# Patient Record
Sex: Female | Born: 1970 | Race: Black or African American | Hispanic: No | Marital: Single | Smoking: Never smoker
Health system: Southern US, Community
[De-identification: ages and names within clinical notes are randomized; demographics above are authoritative.]

## PROBLEM LIST (undated history)

## (undated) DIAGNOSIS — S060X9A Concussion with loss of consciousness of unspecified duration, initial encounter: Secondary | ICD-10-CM

## (undated) DIAGNOSIS — S060XAA Concussion with loss of consciousness status unknown, initial encounter: Secondary | ICD-10-CM

## (undated) DIAGNOSIS — E611 Iron deficiency: Secondary | ICD-10-CM

## (undated) DIAGNOSIS — E119 Type 2 diabetes mellitus without complications: Secondary | ICD-10-CM

## (undated) DIAGNOSIS — I10 Essential (primary) hypertension: Secondary | ICD-10-CM

## (undated) DIAGNOSIS — D649 Anemia, unspecified: Secondary | ICD-10-CM

## (undated) HISTORY — PX: OTHER SURGICAL HISTORY: SHX169

## (undated) HISTORY — PX: AMPUTATION TOE: SHX6595

---

## 2015-07-11 ENCOUNTER — Inpatient Hospital Stay (HOSPITAL_COMMUNITY)
Admission: EM | Admit: 2015-07-11 | Discharge: 2015-07-13 | DRG: 684 | Discharge status: Against Medical Advice | Payer: Medicare Other | Attending: Family Medicine | Admitting: Family Medicine

## 2015-07-11 ENCOUNTER — Encounter (HOSPITAL_COMMUNITY): Payer: Self-pay

## 2015-07-11 ENCOUNTER — Observation Stay (HOSPITAL_COMMUNITY): Payer: Medicare Other

## 2015-07-11 DIAGNOSIS — E1122 Type 2 diabetes mellitus with diabetic chronic kidney disease: Secondary | ICD-10-CM

## 2015-07-11 DIAGNOSIS — E860 Dehydration: Secondary | ICD-10-CM | POA: Diagnosis not present

## 2015-07-11 DIAGNOSIS — R6881 Early satiety: Secondary | ICD-10-CM | POA: Diagnosis present

## 2015-07-11 DIAGNOSIS — L97529 Non-pressure chronic ulcer of other part of left foot with unspecified severity: Secondary | ICD-10-CM | POA: Diagnosis present

## 2015-07-11 DIAGNOSIS — Z794 Long term (current) use of insulin: Secondary | ICD-10-CM

## 2015-07-11 DIAGNOSIS — I10 Essential (primary) hypertension: Secondary | ICD-10-CM | POA: Diagnosis present

## 2015-07-11 DIAGNOSIS — Z9114 Patient's other noncompliance with medication regimen: Secondary | ICD-10-CM

## 2015-07-11 DIAGNOSIS — D72829 Elevated white blood cell count, unspecified: Secondary | ICD-10-CM | POA: Diagnosis present

## 2015-07-11 DIAGNOSIS — I129 Hypertensive chronic kidney disease with stage 1 through stage 4 chronic kidney disease, or unspecified chronic kidney disease: Secondary | ICD-10-CM | POA: Diagnosis present

## 2015-07-11 DIAGNOSIS — R112 Nausea with vomiting, unspecified: Secondary | ICD-10-CM | POA: Diagnosis present

## 2015-07-11 DIAGNOSIS — D638 Anemia in other chronic diseases classified elsewhere: Secondary | ICD-10-CM | POA: Diagnosis present

## 2015-07-11 DIAGNOSIS — Z89422 Acquired absence of other left toe(s): Secondary | ICD-10-CM

## 2015-07-11 DIAGNOSIS — E11621 Type 2 diabetes mellitus with foot ulcer: Secondary | ICD-10-CM | POA: Diagnosis present

## 2015-07-11 DIAGNOSIS — T8130XA Disruption of wound, unspecified, initial encounter: Secondary | ICD-10-CM

## 2015-07-11 DIAGNOSIS — Z951 Presence of aortocoronary bypass graft: Secondary | ICD-10-CM

## 2015-07-11 DIAGNOSIS — E8809 Other disorders of plasma-protein metabolism, not elsewhere classified: Secondary | ICD-10-CM | POA: Diagnosis present

## 2015-07-11 DIAGNOSIS — R197 Diarrhea, unspecified: Secondary | ICD-10-CM | POA: Diagnosis present

## 2015-07-11 DIAGNOSIS — E1165 Type 2 diabetes mellitus with hyperglycemia: Secondary | ICD-10-CM | POA: Diagnosis present

## 2015-07-11 DIAGNOSIS — Z590 Homelessness: Secondary | ICD-10-CM

## 2015-07-11 DIAGNOSIS — N189 Chronic kidney disease, unspecified: Secondary | ICD-10-CM | POA: Diagnosis present

## 2015-07-11 DIAGNOSIS — N183 Chronic kidney disease, stage 3 (moderate): Secondary | ICD-10-CM

## 2015-07-11 DIAGNOSIS — N179 Acute kidney failure, unspecified: Secondary | ICD-10-CM | POA: Diagnosis not present

## 2015-07-11 DIAGNOSIS — E119 Type 2 diabetes mellitus without complications: Secondary | ICD-10-CM

## 2015-07-11 HISTORY — DX: Essential (primary) hypertension: I10

## 2015-07-11 HISTORY — DX: Anemia, unspecified: D64.9

## 2015-07-11 HISTORY — DX: Concussion with loss of consciousness of unspecified duration, initial encounter: S06.0X9A

## 2015-07-11 HISTORY — DX: Concussion with loss of consciousness status unknown, initial encounter: S06.0XAA

## 2015-07-11 HISTORY — DX: Type 2 diabetes mellitus without complications: E11.9

## 2015-07-11 HISTORY — DX: Iron deficiency: E61.1

## 2015-07-11 LAB — CBC
HCT: 25.8 % — ABNORMAL LOW (ref 36.0–46.0)
Hemoglobin: 8.2 g/dL — ABNORMAL LOW (ref 12.0–15.0)
MCH: 23.9 pg — ABNORMAL LOW (ref 26.0–34.0)
MCHC: 31.8 g/dL (ref 30.0–36.0)
MCV: 75.2 fL — ABNORMAL LOW (ref 78.0–100.0)
PLATELETS: 315 10*3/uL (ref 150–400)
RBC: 3.43 MIL/uL — ABNORMAL LOW (ref 3.87–5.11)
RDW: 17.8 % — AB (ref 11.5–15.5)
WBC: 15.5 10*3/uL — AB (ref 4.0–10.5)

## 2015-07-11 LAB — COMPREHENSIVE METABOLIC PANEL
ALBUMIN: 2.7 g/dL — AB (ref 3.5–5.0)
ALT: 12 U/L — ABNORMAL LOW (ref 14–54)
AST: 18 U/L (ref 15–41)
Alkaline Phosphatase: 72 U/L (ref 38–126)
Anion gap: 7 (ref 5–15)
BUN: 26 mg/dL — AB (ref 6–20)
CALCIUM: 8.3 mg/dL — AB (ref 8.9–10.3)
CO2: 21 mmol/L — ABNORMAL LOW (ref 22–32)
CREATININE: 2.02 mg/dL — AB (ref 0.44–1.00)
Chloride: 105 mmol/L (ref 101–111)
GFR calc Af Amer: 33 mL/min — ABNORMAL LOW (ref >60)
GFR, EST NON AFRICAN AMERICAN: 29 mL/min — AB (ref >60)
Glucose, Bld: 151 mg/dL — ABNORMAL HIGH (ref 65–99)
POTASSIUM: 3.6 mmol/L (ref 3.5–5.1)
SODIUM: 133 mmol/L — AB (ref 135–145)
TOTAL PROTEIN: 7.2 g/dL (ref 6.5–8.1)
Total Bilirubin: 0.7 mg/dL (ref 0.3–1.2)

## 2015-07-11 LAB — LIPASE, BLOOD: LIPASE: 38 U/L (ref 11–51)

## 2015-07-11 MED ORDER — ONDANSETRON HCL 4 MG/2ML IJ SOLN
4.0000 mg | Freq: Four times a day (QID) | INTRAMUSCULAR | Status: DC | PRN
  Administered 2015-07-12: 4 mg via INTRAVENOUS
  Filled 2015-07-11: qty 2

## 2015-07-11 MED ORDER — INSULIN ASPART 100 UNIT/ML ~~LOC~~ SOLN
0.0000 [IU] | SUBCUTANEOUS | Status: DC
  Administered 2015-07-12 (×4): 1 [IU] via SUBCUTANEOUS

## 2015-07-11 MED ORDER — SODIUM CHLORIDE 0.9 % IJ SOLN
3.0000 mL | Freq: Two times a day (BID) | INTRAMUSCULAR | Status: DC
  Administered 2015-07-12 (×3): 3 mL via INTRAVENOUS

## 2015-07-11 MED ORDER — SODIUM CHLORIDE 0.9 % IV SOLN
INTRAVENOUS | Status: AC
  Administered 2015-07-12 (×2): via INTRAVENOUS

## 2015-07-11 MED ORDER — ACETAMINOPHEN 650 MG RE SUPP: 650.0000 mg | Freq: Four times a day (QID) | RECTAL | Status: DC | PRN

## 2015-07-11 MED ORDER — ONDANSETRON HCL 4 MG/2ML IJ SOLN
4.0000 mg | Freq: Once | INTRAMUSCULAR | Status: AC
  Administered 2015-07-11: 4 mg via INTRAVENOUS
  Filled 2015-07-11: qty 2

## 2015-07-11 MED ORDER — ACETAMINOPHEN 325 MG PO TABS: 650.0000 mg | ORAL_TABLET | Freq: Four times a day (QID) | ORAL | Status: DC | PRN

## 2015-07-11 MED ORDER — ONDANSETRON HCL 4 MG PO TABS: 4.0000 mg | ORAL_TABLET | Freq: Four times a day (QID) | ORAL | Status: DC | PRN

## 2015-07-11 MED ORDER — ENOXAPARIN SODIUM 30 MG/0.3ML ~~LOC~~ SOLN
30.0000 mg | Freq: Every day | SUBCUTANEOUS | Status: DC
  Filled 2015-07-11 (×3): qty 0.3

## 2015-07-11 MED ORDER — SODIUM CHLORIDE 0.9 % IV BOLUS (SEPSIS)
500.0000 mL | Freq: Once | INTRAVENOUS | Status: AC
  Administered 2015-07-11: 500 mL via INTRAVENOUS

## 2015-07-11 NOTE — ED Notes (Signed)
Per EMS, pt complains of n/v/d for 3 days. Last oral intake 2PM yesterday. Pt has hx of diabetes, CBG 173. Pt states she has not been taking her medication for hypertension or diabetes. Pt denies abdominal pain.

## 2015-07-11 NOTE — ED Notes (Addendum)
Patient states she has had N/V for several months. Patient states she went to [facility] 2 days ago. Patient states she was asked to leave by security because she did not feel well enough to check in. patient then states she sat in their parking lot for hours afterwards. Patient states she drove herself to the ED today. Patient has open wounds to both feet. 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 Patient also reports that she lives in a shelter in [place] and is trying to relocate to this area. Patient states she was going to the wound care center in [place], but did not want to stay in [place] because she could not obtain proper medical care. Patient states she had increased N/V 2 days ago and was vomiting green bile.

## 2015-07-11 NOTE — ED Notes (Signed)
Bed: WA14 Expected date:  Expected time:  Means of arrival:  Comments: N/V 

## 2015-07-11 NOTE — H&P (Addendum)
PCP:  No primary care provider on file.    Referring provider Resse   Chief Complaint: Nausea vomiting  HPI: Joy Levy is a 44 y.o. female   has a past medical history of Anemia; Concussion; Low iron; Diabetes mellitus without complication (HCC); and Hypertension.   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 Patient is currently homeless resides in a facility, Patient in the passed used to go to wound care in [place] but have since then try to relocate to [place]. Patient states that she has been having some chronic nausea and vomiting but it seems to be worse over past 2 days. This has been associated diarrhea. Patient has not been taking any of her medications. She was noted to have elevated blood patella cut up to 15.5 and elevated creatinine at 2.02. From baseline 1.5 in October Patient's hemoglobin down to 8.2 from baseline of 9.1 and October. She denies blood in stool, still menstruating occasional heavy menses but not on the regular bases.   Guarding patient's chronic ulcers she has been in the past prescribed antibiotics but has not filled them. In October she was seen by [facility] plan was to admit patient and she was given IV vancomycin but left AMA.  She was  in the park for some time in pain and drove to [facility] She may have attempted to present to [facility] yesterday but apparently just set in the waiting room. Hospitalist was called for admission for dehydration and acute on chronic renal failure  Review of Systems:    Pertinent positives include:  nausea, vomiting,  fatigue,   Constitutional:  No weight loss, night sweats, Fevers, chills,weight loss  HEENT:  No headaches, Difficulty swallowing,Tooth/dental problems,Sore throat,  No sneezing, itching, ear ache, nasal congestion, post nasal drip,  Cardio-vascular:  No chest  pain, Orthopnea, PND, anasarca, dizziness, palpitations.no Bilateral lower extremity swelling  GI:  No heartburn, indigestion, abdominal pain,  diarrhea, change in bowel habits, loss of appetite, melena, blood in stool, hematemesis Resp:  no shortness of breath at rest. No dyspnea on exertion, No excess mucus, no productive cough, No non-productive cough, No coughing up of blood.No change in color of mucus.No wheezing. Skin:  no rash or lesions. No jaundice GU:  no dysuria, change in color of urine, no urgency or frequency. No straining to urinate.  No flank pain.  Musculoskeletal:  No joint pain or no joint swelling. No decreased range of motion. No back pain.  Psych:  No change in mood or affect. No depression or anxiety. No memory loss.  Neuro: no localizing neurological complaints, no tingling, no weakness, no double vision, no gait abnormality, no slurred speech, no confusion  Otherwise ROS are negative except for above, 10 systems were reviewed  Past Medical History: Past Medical History  Diagnosis Date  . Anemia   . Concussion   . Low iron   . Diabetes mellitus without complication (HCC)   . Hypertension    Past Surgical History  Procedure Laterality Date  . Amputation toe    . Iron deficiency       Medications: Prior to Admission medications   Not on File    Allergies:  No Known Allergies  Social History:  Ambulatory  Independently  Homeless    reports that she has never smoked. She has never used smokeless tobacco. She reports that she does  not drink alcohol or use illicit drugs.    Family History: family history includes Diabetes in her other; Hypertension in her other.    Physical Exam: Patient Vitals for the past 24 hrs:  BP Temp Temp src Pulse Resp SpO2  07/11/15 2016 153/68 mmHg - - 95 19 96 %  07/11/15 1821 181/80 mmHg 97.7 F (36.5 C) Oral 107 18 100 %    1. General:  in No Acute distress 2. Psychological: Alert and  Oriented 3.  Head/ENT:     Dry Mucous Membranes                          Head Non traumatic, neck supple                            Poor Dentition 4. SKIN:   decreased Skin turgor,  Skin clean Dry well healed Ulceration on the left foot noted     Regular rate and rhythm no Murmur, Rub or gallop 6. Lungs: Clear to auscultation bilaterally, no wheezes or crackles   7. Abdomen: Soft, non-tender, Non distended 8. Lower extremities: no clubbing, cyanosis, or edema 9. Neurologically Grossly intact, moving all 4 extremities equally 10. MSK: Normal range of motion  body mass index is unknown because there is no height or weight on file.   Labs on Admission:   Results for orders placed or performed during the hospital encounter of 07/11/15 (from the past 24 hour(s))  Lipase, blood     Status: None   Collection Time: 07/11/15  8:06 PM  Result Value Ref Range   Lipase 38 11 - 51 U/L  Comprehensive metabolic panel     Status: Abnormal   Collection Time: 07/11/15  8:06 PM  Result Value Ref Range   Sodium 133 (L) 135 - 145 mmol/L   Potassium 3.6 3.5 - 5.1 mmol/L   Chloride 105 101 - 111 mmol/L   CO2 21 (L) 22 - 32 mmol/L   Glucose, Bld 151 (H) 65 - 99 mg/dL   BUN 26 (H) 6 - 20 mg/dL   Creatinine, Ser 1.61 (H) 0.44 - 1.00 mg/dL   Calcium 8.3 (L) 8.9 - 10.3 mg/dL   Total Protein 7.2 6.5 - 8.1 g/dL   Albumin 2.7 (L) 3.5 - 5.0 g/dL   AST 18 15 - 41 U/L   ALT 12 (L) 14 - 54 U/L   Alkaline Phosphatase 72 38 - 126 U/L   Total Bilirubin 0.7 0.3 - 1.2 mg/dL   GFR calc non Af Amer 29 (L) >60 mL/min   GFR calc Af Amer 33 (L) >60 mL/min   Anion gap 7 5 - 15  CBC     Status: Abnormal   Collection Time: 07/11/15  8:06 PM  Result Value Ref Range   WBC 15.5 (H) 4.0 - 10.5 K/uL   RBC 3.43 (L) 3.87 - 5.11 MIL/uL   Hemoglobin 8.2 (L) 12.0 - 15.0 g/dL   HCT 09.6 (L) 04.5 - 40.9 %   MCV 75.2 (L) 78.0 - 100.0 fL   MCH 23.9 (L) 26.0 - 34.0 pg   MCHC 31.8 30.0 - 36.0 g/dL   RDW 81.1 (H) 91.4 - 78.2 %   Platelets  315 150 - 400 K/uL    UA ordered  No results found for: HGBA1C  CrCl cannot be calculated (Unknown ideal weight.).  BNP (last 3 results) No results for  input(s): PROBNP in the last 8760 hours.  Other results:  I have pearsonaly reviewed this: ECG REPORT Not obtained   There were no vitals filed for this visit.   Cultures: No results found for: SDES, SPECREQUEST, CULT, REPTSTATUS   Radiological Exams on Admission: No results found.  Chart has been reviewed  Family not at  Bedside   Assessment/Plan   44 year old female with history of diabetes and diabetic foot ulcers presents with nausea vomiting and diarrhea for the past 3 days with evidence of acute on chronic renal failure and dehydration   Present on Admission:  . Dehydration - administer IV fluids . Acute on chronic renal failure (HCC) - - likely secondary to dehydration, check FeNA and if not improved with IVF would obtain renal US   . Anemia of chronic disease - will obtain anemia panel, reports no blood in stool but some occasional heavy vaginal bleeding  . Nausea vomiting and diarrhea - obtain stool culture, have been on antibiotics in the past . Leukocytosis no fever, will rehydrate,  . Hypertension - stopped taking her meds BP elevated but patient refused to take any BP meds.  Hypoalbuminemia - will check prealbumin, given relatively normal protein check HIV status and hepatitis C status  Prophylaxis:  Lovenox   CODE STATUS:    Full Code as per patient    Disposition:will need social work to help possible placement The rest of the plan as per orders.  I have spent a total of 55 min on this admission  Yuta Cipollone 07/11/2015, 10:09 PM  [facility]  Pager [phone]   after 2 AM please page floor coverage PA If 7AM-7PM, please contact the day team taking care of the patient  [email or web address]  Password [email or web address]

## 2015-07-11 NOTE — ED Provider Notes (Signed)
CSN: [id number]     Arrival date & time 07/11/15  1812 History   First MD Initiated Contact with Patient 07/11/15 2012     Chief Complaint  Patient presents with  . Emesis  . Diarrhea     Patient is a 44 y.o. female presenting with vomiting and diarrhea. The history is provided by the patient. No language interpreter was used.  Emesis Associated symptoms: diarrhea   Diarrhea Associated symptoms: vomiting    Salley SlaughterCharlene Parmelee is a 44 y.o. female w/ hx/o HTN and DM who presents to the Emergency Department complaining of nausea and vomiting.  She reports weeks to months of intermittent vomiting.  She reports significant vomiting for the last several days, reported as bilious.  She had a small episode of diarrhea earlier today.  She denies fevers, abdominal pain, chest pain, SOB.  She has occasional dysuria.  She is from [place] but currently staying in [place].  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  She is not currently on any medications and does not have a current doctor.  She was on abx a few weeks ago for a foot infection..    Past Medical History  Diagnosis Date  . Anemia   . Concussion   . Low iron   . Diabetes mellitus without complication (HCC)   . Hypertension    Past Surgical History  Procedure Laterality Date  . Amputation toe    . Iron deficiency     History reviewed. No pertinent family history. Social History  Substance Use Topics  . Smoking status: Never Smoker   . Smokeless tobacco: Never Used  . Alcohol Use: No   OB History    No data available     Review of Systems  Gastrointestinal: Positive for vomiting and diarrhea.  All other systems reviewed and are negative.     Allergies  Review of patient's allergies indicates no known allergies.  Home Medications   Prior to Admission medications   Not on File   BP 153/68 mmHg  Pulse 95  Temp(Src) 97.7 F (36.5 C) (Oral)  Resp 19  SpO2 96%  LMP 06/10/2015 Physical  Exam  Constitutional: She is oriented to person, place, and time. She appears well-developed and well-nourished.  HENT:  Head: Normocephalic and atraumatic.  Cardiovascular: Regular rhythm.   No murmur heard. tachycardic  Pulmonary/Chest: Effort normal and breath sounds normal. No respiratory distress.  Abdominal: Soft. There is no tenderness. There is no rebound and no guarding.  Musculoskeletal:  LLE with TMT amputation, healing.    Neurological: She is alert and oriented to person, place, and time.  Skin: Skin is warm and dry.  Psychiatric: She has a normal mood and affect. Her behavior is normal.  Nursing note and vitals reviewed.   ED Course  Procedures (including critical care time) Labs Review Labs Reviewed  COMPREHENSIVE METABOLIC PANEL - Abnormal; Notable for the following:    Sodium 133 (*)    CO2 21 (*)    Glucose, Bld 151 (*)    BUN 26 (*)    Creatinine, Ser 2.02 (*)    Calcium 8.3 (*)    Albumin 2.7 (*)    ALT 12 (*)    GFR calc non Af Amer 29 (*)    GFR calc Af Amer 33 (*)    All other components within normal limits  CBC - Abnormal; Notable for the following:    WBC 15.5 (*)  RBC 3.43 (*)    Hemoglobin 8.2 (*)    HCT 25.8 (*)    MCV 75.2 (*)    MCH 23.9 (*)    RDW 17.8 (*)    All other components within normal limits  LIPASE, BLOOD    Imaging Review No results found. I have personally reviewed and evaluated these images and lab results as part of my medical decision-making.   EKG Interpretation None      MDM   Final diagnoses:  Acute kidney injury [facility])    Patient with history of hypertension and diabetes, noncompliant with medications here with vomiting, one episode of diarrhea. She is dehydrated on examination with benign abdominal examination. BMP demonstrates acute kidney injury when compared to prior that was obtained through care everywhere one month ago. CBC was stable anemia but leukocytosis, unclear significance. UA pending. Plan  to admit for IV fluids, antiemetics. Medicine consulted for admission.  Tilden Fossa, MD 07/11/15 [phone]

## 2015-07-12 ENCOUNTER — Observation Stay (HOSPITAL_COMMUNITY): Payer: Medicare Other

## 2015-07-12 DIAGNOSIS — I10 Essential (primary) hypertension: Secondary | ICD-10-CM | POA: Diagnosis not present

## 2015-07-12 DIAGNOSIS — D72829 Elevated white blood cell count, unspecified: Secondary | ICD-10-CM | POA: Diagnosis present

## 2015-07-12 DIAGNOSIS — L97529 Non-pressure chronic ulcer of other part of left foot with unspecified severity: Secondary | ICD-10-CM | POA: Diagnosis present

## 2015-07-12 DIAGNOSIS — E8809 Other disorders of plasma-protein metabolism, not elsewhere classified: Secondary | ICD-10-CM | POA: Diagnosis present

## 2015-07-12 DIAGNOSIS — R112 Nausea with vomiting, unspecified: Secondary | ICD-10-CM | POA: Diagnosis present

## 2015-07-12 DIAGNOSIS — N189 Chronic kidney disease, unspecified: Secondary | ICD-10-CM | POA: Diagnosis present

## 2015-07-12 DIAGNOSIS — D638 Anemia in other chronic diseases classified elsewhere: Secondary | ICD-10-CM | POA: Diagnosis present

## 2015-07-12 DIAGNOSIS — Z590 Homelessness: Secondary | ICD-10-CM | POA: Diagnosis not present

## 2015-07-12 DIAGNOSIS — Z951 Presence of aortocoronary bypass graft: Secondary | ICD-10-CM | POA: Diagnosis not present

## 2015-07-12 DIAGNOSIS — N179 Acute kidney failure, unspecified: Secondary | ICD-10-CM | POA: Diagnosis present

## 2015-07-12 DIAGNOSIS — E11621 Type 2 diabetes mellitus with foot ulcer: Secondary | ICD-10-CM | POA: Diagnosis present

## 2015-07-12 DIAGNOSIS — R197 Diarrhea, unspecified: Secondary | ICD-10-CM | POA: Diagnosis present

## 2015-07-12 DIAGNOSIS — I129 Hypertensive chronic kidney disease with stage 1 through stage 4 chronic kidney disease, or unspecified chronic kidney disease: Secondary | ICD-10-CM | POA: Diagnosis present

## 2015-07-12 DIAGNOSIS — E1165 Type 2 diabetes mellitus with hyperglycemia: Secondary | ICD-10-CM | POA: Diagnosis present

## 2015-07-12 DIAGNOSIS — Z794 Long term (current) use of insulin: Secondary | ICD-10-CM | POA: Diagnosis not present

## 2015-07-12 DIAGNOSIS — R6881 Early satiety: Secondary | ICD-10-CM | POA: Diagnosis present

## 2015-07-12 DIAGNOSIS — E860 Dehydration: Secondary | ICD-10-CM | POA: Diagnosis present

## 2015-07-12 DIAGNOSIS — Z9114 Patient's other noncompliance with medication regimen: Secondary | ICD-10-CM | POA: Diagnosis not present

## 2015-07-12 DIAGNOSIS — Z89422 Acquired absence of other left toe(s): Secondary | ICD-10-CM | POA: Diagnosis not present

## 2015-07-12 LAB — FERRITIN: Ferritin: 75 ng/mL (ref 11–307)

## 2015-07-12 LAB — URINE MICROSCOPIC-ADD ON

## 2015-07-12 LAB — RAPID URINE DRUG SCREEN, HOSP PERFORMED
Amphetamines: NOT DETECTED
Barbiturates: NOT DETECTED
Benzodiazepines: NOT DETECTED
COCAINE: NOT DETECTED
OPIATES: NOT DETECTED
TETRAHYDROCANNABINOL: NOT DETECTED

## 2015-07-12 LAB — CBC
HEMATOCRIT: 24.7 % — AB (ref 36.0–46.0)
HEMOGLOBIN: 7.8 g/dL — AB (ref 12.0–15.0)
MCH: 23.4 pg — AB (ref 26.0–34.0)
MCHC: 31.6 g/dL (ref 30.0–36.0)
MCV: 74 fL — AB (ref 78.0–100.0)
PLATELETS: 322 10*3/uL (ref 150–400)
RBC: 3.34 MIL/uL — AB (ref 3.87–5.11)
RDW: 17.9 % — ABNORMAL HIGH (ref 11.5–15.5)
WBC: 13.3 10*3/uL — AB (ref 4.0–10.5)

## 2015-07-12 LAB — URINALYSIS, ROUTINE W REFLEX MICROSCOPIC
BILIRUBIN URINE: NEGATIVE
Glucose, UA: 100 mg/dL — AB
KETONES UR: NEGATIVE mg/dL
Leukocytes, UA: NEGATIVE
NITRITE: NEGATIVE
PH: 6 (ref 5.0–8.0)
Protein, ur: >300 mg/dL — AB
Specific Gravity, Urine: 1.011 (ref 1.005–1.030)

## 2015-07-12 LAB — COMPREHENSIVE METABOLIC PANEL
ALT: 10 U/L — AB (ref 14–54)
ANION GAP: 7 (ref 5–15)
AST: 14 U/L — ABNORMAL LOW (ref 15–41)
Albumin: 2.3 g/dL — ABNORMAL LOW (ref 3.5–5.0)
Alkaline Phosphatase: 67 U/L (ref 38–126)
BUN: 25 mg/dL — ABNORMAL HIGH (ref 6–20)
CHLORIDE: 107 mmol/L (ref 101–111)
CO2: 22 mmol/L (ref 22–32)
CREATININE: 2.06 mg/dL — AB (ref 0.44–1.00)
Calcium: 8.4 mg/dL — ABNORMAL LOW (ref 8.9–10.3)
GFR, EST AFRICAN AMERICAN: 32 mL/min — AB (ref >60)
GFR, EST NON AFRICAN AMERICAN: 28 mL/min — AB (ref >60)
Glucose, Bld: 127 mg/dL — ABNORMAL HIGH (ref 65–99)
POTASSIUM: 3.5 mmol/L (ref 3.5–5.1)
SODIUM: 136 mmol/L (ref 135–145)
Total Bilirubin: 0.8 mg/dL (ref 0.3–1.2)
Total Protein: 6.8 g/dL (ref 6.5–8.1)

## 2015-07-12 LAB — PREGNANCY, URINE: PREG TEST UR: NEGATIVE

## 2015-07-12 LAB — IRON AND TIBC
IRON: 11 ug/dL — AB (ref 28–170)
Saturation Ratios: 4 % — ABNORMAL LOW (ref 10.4–31.8)
TIBC: 252 ug/dL (ref 250–450)
UIBC: 241 ug/dL

## 2015-07-12 LAB — SODIUM, URINE, RANDOM: Sodium, Ur: 33 mmol/L

## 2015-07-12 LAB — GLUCOSE, CAPILLARY
GLUCOSE-CAPILLARY: 132 mg/dL — AB (ref 65–99)
GLUCOSE-CAPILLARY: 133 mg/dL — AB (ref 65–99)
Glucose-Capillary: 145 mg/dL — ABNORMAL HIGH (ref 65–99)
Glucose-Capillary: 127 mg/dL — ABNORMAL HIGH (ref 65–99)

## 2015-07-12 LAB — MAGNESIUM: MAGNESIUM: 1.8 mg/dL (ref 1.7–2.4)

## 2015-07-12 LAB — PHOSPHORUS: PHOSPHORUS: 4 mg/dL (ref 2.5–4.6)

## 2015-07-12 LAB — RETICULOCYTES
RBC.: 3.34 MIL/uL — AB (ref 3.87–5.11)
RETIC COUNT ABSOLUTE: 20 10*3/uL (ref 19.0–186.0)
Retic Ct Pct: 0.6 % (ref 0.4–3.1)

## 2015-07-12 LAB — PREALBUMIN: Prealbumin: 15.1 mg/dL — ABNORMAL LOW (ref 18–38)

## 2015-07-12 LAB — CREATININE, URINE, RANDOM: Creatinine, Urine: 84.29 mg/dL

## 2015-07-12 LAB — VITAMIN B12: VITAMIN B 12: 300 pg/mL (ref 180–914)

## 2015-07-12 LAB — HIV ANTIBODY (ROUTINE TESTING W REFLEX): HIV Screen 4th Generation wRfx: NONREACTIVE

## 2015-07-12 LAB — TSH: TSH: 1.429 u[IU]/mL (ref 0.350–4.500)

## 2015-07-12 LAB — FOLATE: FOLATE: 7.4 ng/mL (ref >5.9)

## 2015-07-12 MED ORDER — BOOST / RESOURCE BREEZE PO LIQD
1.0000 | Freq: Two times a day (BID) | ORAL | Status: DC
  Administered 2015-07-12: 1 via ORAL

## 2015-07-12 MED ORDER — SODIUM CHLORIDE 0.9 % IV SOLN
INTRAVENOUS | Status: AC
  Administered 2015-07-12: 13:00:00 via INTRAVENOUS

## 2015-07-12 NOTE — Progress Notes (Addendum)
[facility] PROGRESS NOTE  Joy Levy ZOX:[id number] DOB: 07/30/1971 DOA: 07/11/2015 PCP: No primary care provider on file.  Assessment/Plan:  1. Nausea/vomiting/early satiety -Chronic, stable suspect gastroparesis -No further vomiting noted in the hospital -Will advance diet -Add Reglan, depending on symptoms  2. Type 2 diabetes -Has not been taking insulin for over 2 years -CABG stable, follow-up A1c, continue sliding scale for now  3. Chronic left foot wound with discharge -Supposed to be followed at the wound center in [place] she had an MRI reportedly a month ago, she does not know the results -I have requested records from [facility] -Wound RN consulted -No overt signs and symptoms of acute infection -check Xray foot  4. Renal insufficiency  -Baseline unknown , acute on chronic versus CK D3  -Hydrate, monitor, urine output good  -Await records from [facility] -  5 anemia -Due to menorrhagia and chronic disease  6.  homelessness  -CSW  Consult  DVT proph: lovenix  Code Status: Full code Family Communication: No close family Disposition Plan: To be determined   Consultants:  Wound RN  HPI/Subjective: Homeless for 20+ years, was living in [place] until 2 weeks ago  Objective: Filed Vitals:   07/11/15 2357 07/12/15 0451  BP: 165/78 145/74  Pulse: 82 79  Temp: 99.1 F (37.3 C) 98.9 F (37.2 C)  Resp: 20 20    Intake/Output Summary (Last 24 hours) at 07/12/15 0949 Last data filed at 07/12/15 0935  Gross per 24 hour  Intake      3 ml  Output   1200 ml  Net  -1197 ml   There were no vitals filed for this visit.  Exam:   General:  AAOx3, no distress  Cardiovascular: S1S2/RRR  Respiratory: CTAB  Abdomen: soft, NT, BS present  Musculoskeletal: L foot with forefoot amputation, dry and scaled and ulceration at lateral margin with blood tinged discharge  Data Reviewed: Basic Metabolic Panel:  Recent Labs Lab  07/11/15 2006 07/12/15 0555  NA 133* 136  K 3.6 3.5  CL 105 107  CO2 21* 22  GLUCOSE 151* 127*  BUN 26* 25*  CREATININE 2.02* 2.06*  CALCIUM 8.3* 8.4*  MG  --  1.8  PHOS  --  4.0   Liver Function Tests:  Recent Labs Lab 07/11/15 2006 07/12/15 0555  AST 18 14*  ALT 12* 10*  ALKPHOS 72 67  BILITOT 0.7 0.8  PROT 7.2 6.8  ALBUMIN 2.7* 2.3*    Recent Labs Lab 07/11/15 2006  LIPASE 38   No results for input(s): AMMONIA in the last 168 hours. CBC:  Recent Labs Lab 07/11/15 2006 07/12/15 0555  WBC 15.5* 13.3*  HGB 8.2* 7.8*  HCT 25.8* 24.7*  MCV 75.2* 74.0*  PLT 315 322   Cardiac Enzymes: No results for input(s): CKTOTAL, CKMB, CKMBINDEX, TROPONINI in the last 168 hours. BNP (last 3 results) No results for input(s): BNP in the last 8760 hours.  ProBNP (last 3 results) No results for input(s): PROBNP in the last 8760 hours.  CBG:  Recent Labs Lab 07/12/15 0047 07/12/15 0425 07/12/15 0823  GLUCAP 133* 132* 145*    No results found for this or any previous visit (from the past 240 hour(s)).   Studies: No results found.  Scheduled Meds: . enoxaparin (LOVENOX) injection  30 mg Subcutaneous QHS  . insulin aspart  0-9 Units Subcutaneous 6 times per day  . sodium chloride  3 mL Intravenous Q12H   Continuous Infusions:  Antibiotics  Given (last 72 hours)    None      Active Problems:   Diabetes mellitus (HCC)   Dehydration   Acute on chronic renal failure (HCC)   Anemia of chronic disease   Nausea vomiting and diarrhea   Leukocytosis   Hypertension    Time spent: 35min    [facility] ConnecticutJOSEPH,Alinna Siple  [facility] Pager [phone]. If 7PM-7AM, please contact night-coverage at [email or web address], password [email or web address] 07/12/2015, 9:49 AM

## 2015-07-12 NOTE — Progress Notes (Signed)
Pt refusing to have blood sugars taken anymore and will not wear the centralized heart monitor. RN explained to the pt the importance of having these and she states " she does not want them because it is not what she is in here for".

## 2015-07-12 NOTE — Progress Notes (Signed)
Initial Nutrition Assessment  DOCUMENTATION CODES:   Obesity unspecified  INTERVENTION:  - Will order Boost Breeze TID, each supplement provides 250 kcal and 9 grams of protein - Diet advancement as medically feasible - Will monitor for height and weight to be recorded to determine BMI and calculate nutrition-related needs - RD will continue to monitor for needs  NUTRITION DIAGNOSIS:   Inadequate oral intake related to social / environmental circumstances, acute illness, nausea, vomiting as evidenced by per patient/family report.  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 Patient is currently homeless resides in a facility, Patient in the passed used to go to wound care in [place] but have since then try to relocate to [place].  Pt seen for consult. No height or weight on file so unable to calculate BMI or nutrition needs (kcal and protein) at this time. Pt states she tolerated CLD well this AM with no associated N/V or abdominal pain.   Pt easily gets off topic and talks in circles at times. Able to determine that pt has had N/V and abdominal pain episodes for at least the past 1 year; they are intermittent in nature. She feels that these episodes are related to her current social situation of being homeless and not have consistency in her life.   She states that since Saturday (11/19) she has felt "out of it" and that N/V have been worse since that time than it had been previously. She feels that with good tolerance of CLD this AM that she is ready for diet advancement.  Will order Boost Breeze to supplement and change  supplement as needed with diet advancement. Not meeting needs. Medications reviewed. Labs reviewed; BUN/creatinine elevated, Ca: 8.4 mg/dL, GFR: 32.    Diet Order:  Diet clear liquid Room service appropriate?: Yes; Fluid consistency:: Thin  Skin:  Wound (see comment) (L foot DM ulcer)  Last BM:  11/21  Height:   Ht Readings from Last 1 Encounters:  No data found for Ht    Weight:   Wt Readings from Last 1 Encounters:  No data found for Wt    Ideal Body Weight:   unable to calculate with no height on file  BMI:  There is no height or weight on file to calculate BMI.  Estimated Nutritional Needs:   Kcal:   unable to calculate with no weight on file  Protein:   unable to calculate with no weight on file  Fluid:   2-2.2 L/day  EDUCATION NEEDS:   No education needs identified at this time      Trenton GammonJessica Chaney Ingram, RD, LDN Inpatient Clinical Dietitian Pager # [phone] After hours/weekend pager # [phone]

## 2015-07-12 NOTE — Progress Notes (Signed)
Patient continues to refused to wear cardiac monitor. She states she hopes to be discharged in the AM.

## 2015-07-13 LAB — URINE CULTURE

## 2015-07-13 LAB — HEPATITIS PANEL, ACUTE
HCV AB: 0.2 {s_co_ratio} (ref 0.0–0.9)
HEP A IGM: NEGATIVE
Hep B C IgM: NEGATIVE
Hepatitis B Surface Ag: NEGATIVE

## 2015-07-13 LAB — HEMOGLOBIN A1C
HEMOGLOBIN A1C: 6.4 % — AB (ref 4.8–5.6)
Mean Plasma Glucose: 137 mg/dL

## 2015-07-13 NOTE — Progress Notes (Signed)
Pt refuses dsg change this AM "It is too early". Pt BP elevated but she reports that she does not want to take any medication for her BP.

## 2015-07-13 NOTE — Progress Notes (Signed)
Pt left AMA from the unit. Pt signed the AMA paper and was informed by the MD and RN that it was against medical advice for her to leave. MD did not want to discharge patient due to BP in 210's. Pt has been very verbal towards the RN and CNA throughout the shift. The pt threatened to report the RN and CNA. The Director of the unit was informed by both the RN and CNA about the verbal remarks the pt has made towards them. Safety zone was completed for AMA. Sharley Keeler W Siddhanth Denk, RN

## 2015-07-13 NOTE — Discharge Summary (Addendum)
Patient was refusing medical treatment and requesting discharge. I informed her that was not can be able to discharge her because of her uncontrolled blood pressure. She stated that she did not want medication and preferred to leave AGAINST MEDICAL ADVICE.  Jennfier Abdulla, [employer]

## 2015-07-13 NOTE — Progress Notes (Deleted)
Pt left AMA. Pt signed the AMA paper and was informed that it was against medical advice for the pt to leave due to high BP and plan of care not complete. Cristie Mckinney W Blossom Crume, RN

## 2016-04-28 IMAGING — DX DG ABDOMEN 1V
2 series · 2 of 2 positions shown · non-contrast
Comparison: None.

CLINICAL DATA: Nausea, vomiting and diarrhea.

EXAM:
ABDOMEN - 1 VIEW

[abdomen kub (1 of 2)]
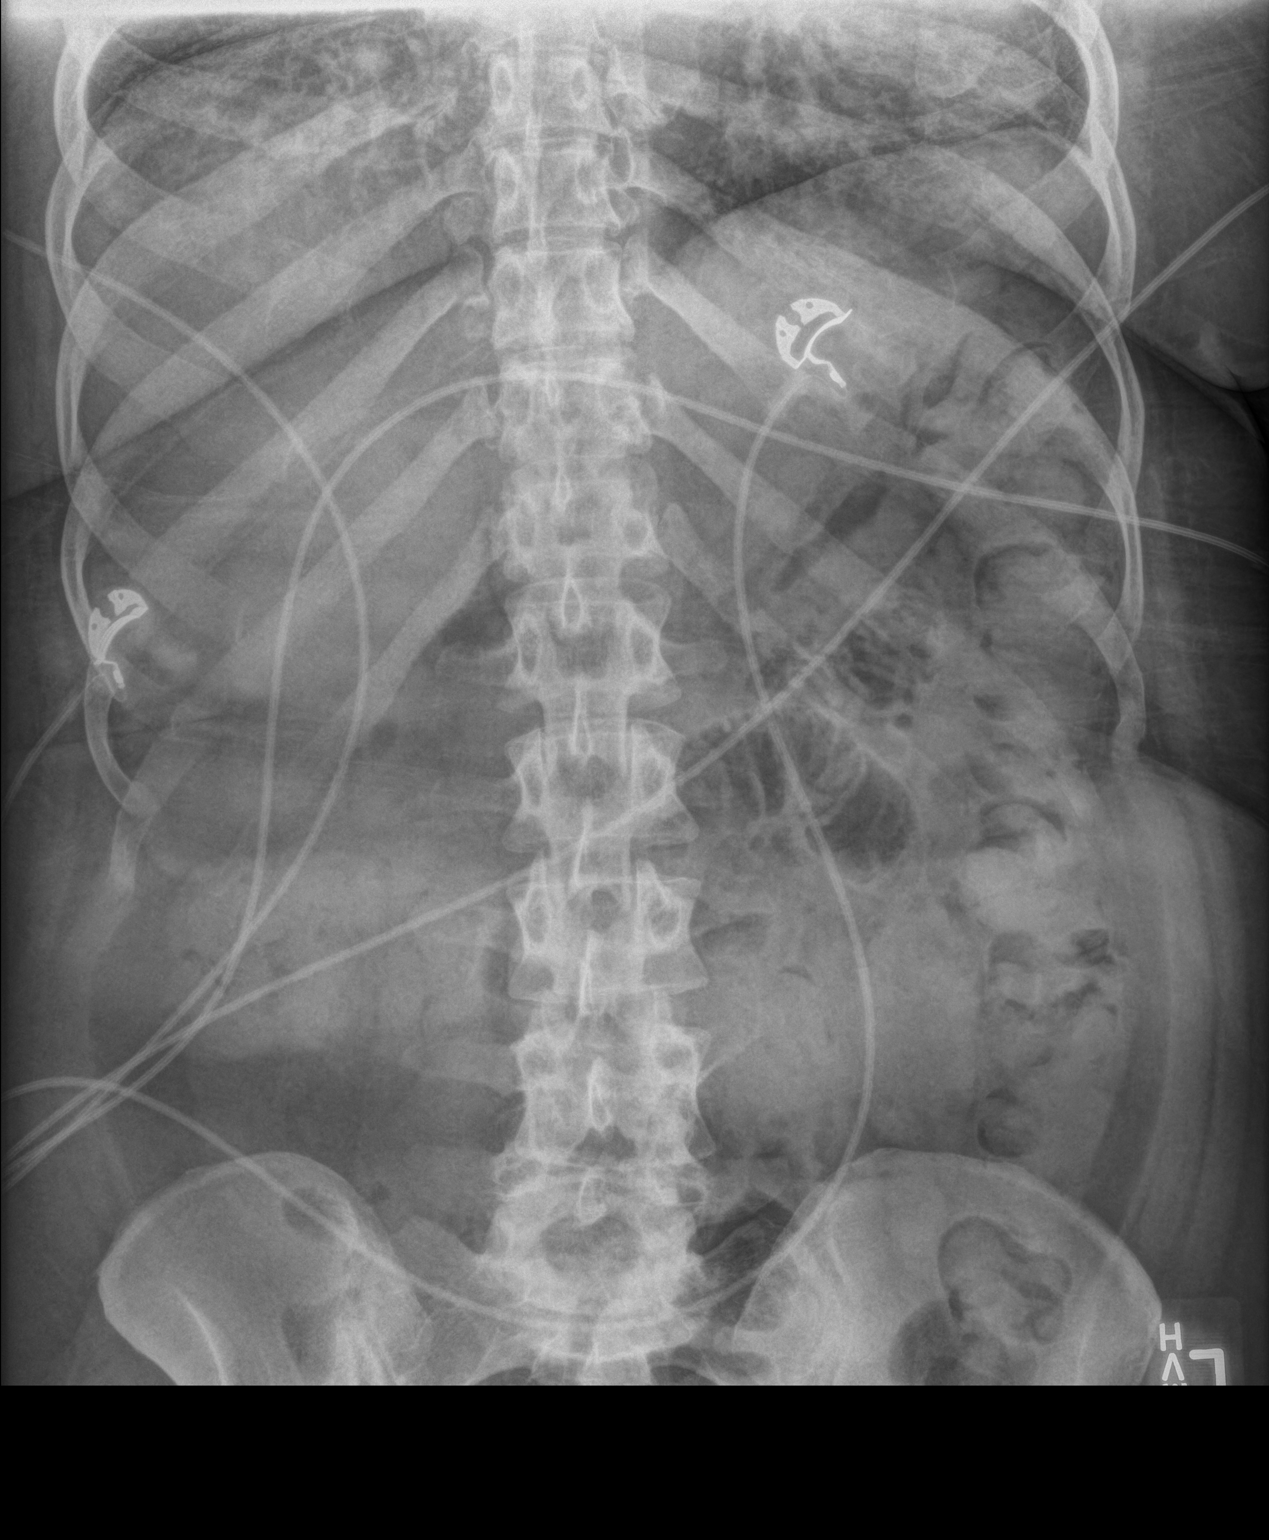

[abdomen kub (2 of 2)]
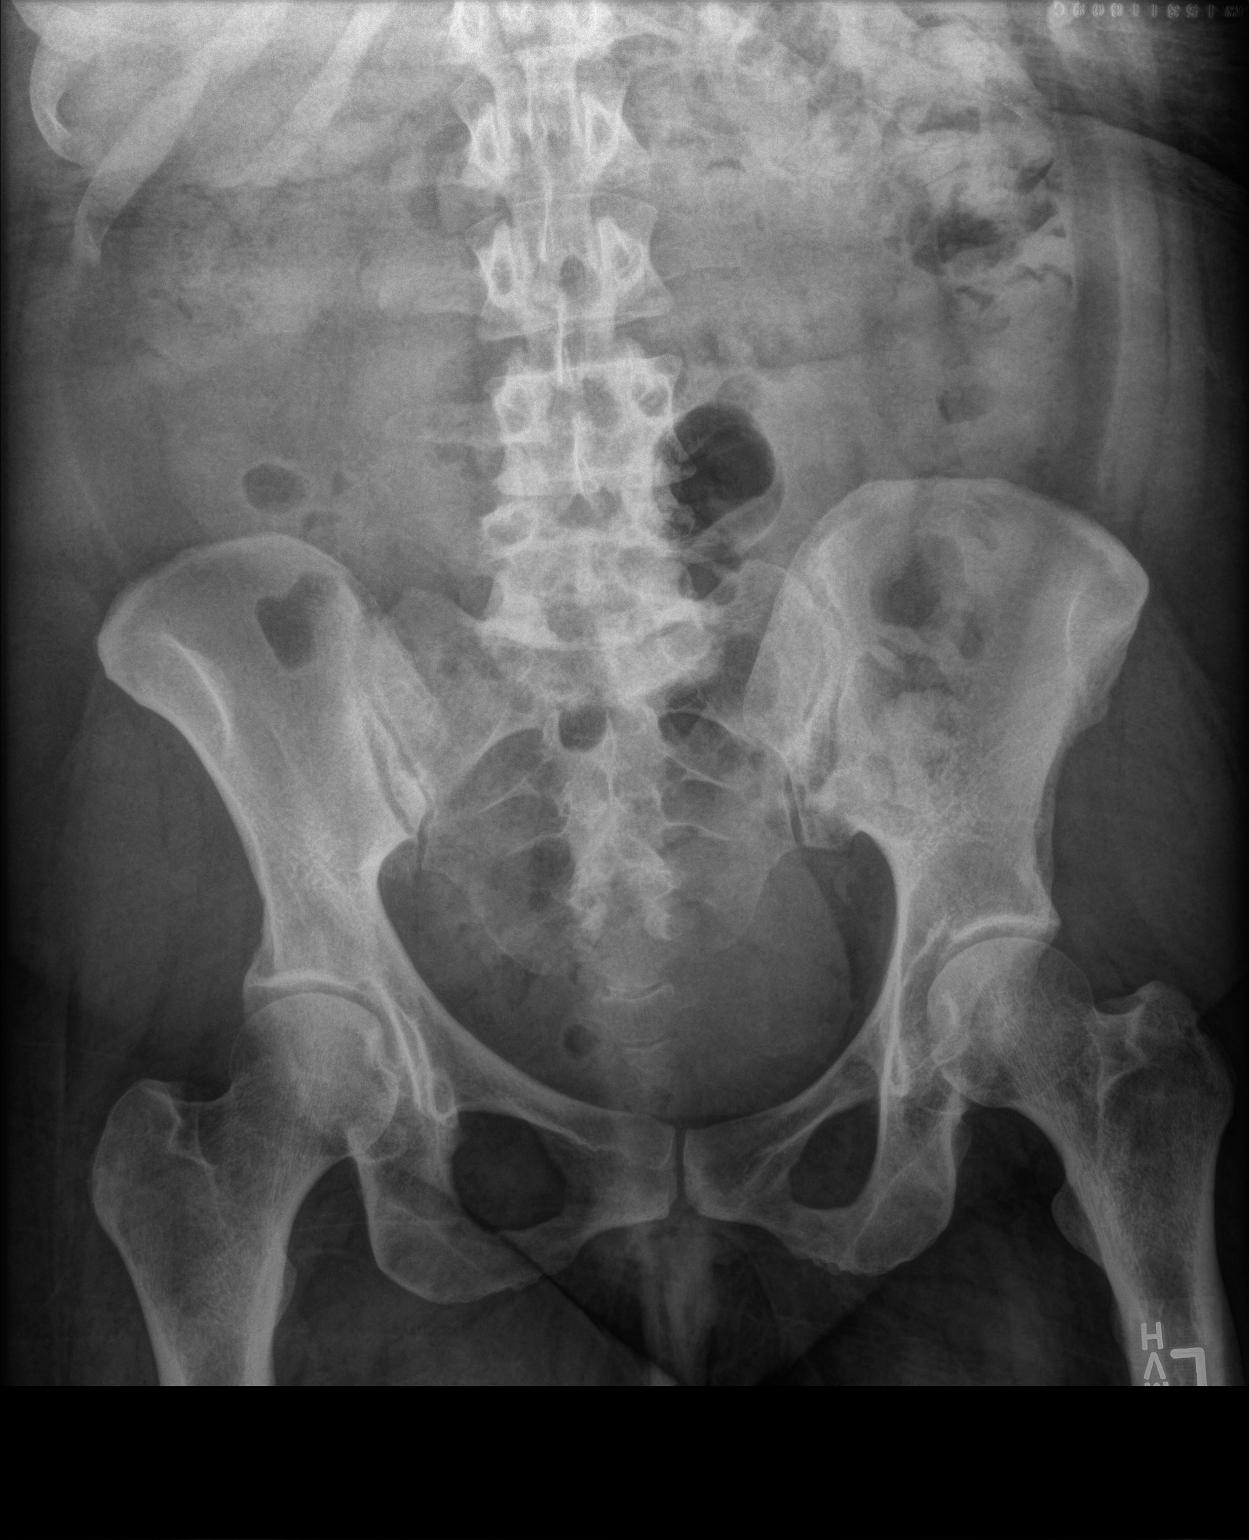

[2 of 2 positions shown; findings below may reference images not displayed]

FINDINGS: There is no bowel dilation to suggest obstruction. Moderate
increased stool is noted throughout the colon.

No evidence of renal or ureteral stones. Soft tissues are
unremarkable. Skeletal structures are unremarkable.
IMPRESSION: 1. No acute findings.  No evidence of bowel obstruction.
2. Moderate increased stool throughout colon.
